# Patient Record
Sex: Male | Born: 1995 | Race: Black or African American | Hispanic: No | Marital: Single | Smoking: Never smoker
Health system: Southern US, Community
[De-identification: ages and names within clinical notes are randomized; demographics above are authoritative.]

---

## 2020-05-17 ENCOUNTER — Encounter (HOSPITAL_COMMUNITY): Payer: Self-pay | Admitting: Emergency Medicine

## 2020-05-17 ENCOUNTER — Other Ambulatory Visit: Payer: Self-pay

## 2020-05-17 ENCOUNTER — Ambulatory Visit (HOSPITAL_COMMUNITY)
Admission: EM | Admit: 2020-05-17 | Discharge: 2020-05-17 | Disposition: A | Discharge status: Home / Self Care | Payer: BC Managed Care – PPO | Attending: Medical Oncology | Admitting: Medical Oncology

## 2020-05-17 DIAGNOSIS — M7652 Patellar tendinitis, left knee: Secondary | ICD-10-CM | POA: Diagnosis not present

## 2020-05-17 DIAGNOSIS — K529 Noninfective gastroenteritis and colitis, unspecified: Secondary | ICD-10-CM | POA: Diagnosis not present

## 2020-05-17 MED ORDER — IBUPROFEN 800 MG PO TABS: 800.0000 mg | ORAL_TABLET | Freq: Three times a day (TID) | ORAL | 0 refills | Status: AC

## 2020-05-17 NOTE — Discharge Instructions (Addendum)
Knee strap

## 2020-05-17 NOTE — ED Triage Notes (Signed)
Woke Thursday feeling bad, abdominal pain, nausea and vomiting and feverish.  On Friday was feeling better, employer asked for him to be evaluated.  Left knee pain for 2 months.  Patient remembers having knee"crossed and sitting on it..stood up "  Has had pain ever since this started.

## 2020-05-17 NOTE — ED Provider Notes (Signed)
[facility]    CSN: [id number] Arrival date & time: 05/17/20  1032      History   Chief Complaint Chief Complaint  Patient presents with  . Letter for School/Work  . Knee Pain    HPI Jesse Wilkinson is a 25 y.o. male.   HPI   Cold symptoms: Pt states that on Thursday he awoke with what felt like food poisioning or a stomach bug. He had abdominal pain, nausea, 2 episodes of non-bloody vomiting and fever. He reports that symptoms resolved as of Friday. He requests a note to return to work. No sick contacts.   Knee Pain: Pt reports that for the past 2 month he has has left knee pain. Started after he was sitting with his knees crossed. Pain described as a tightness that worsened if he stands too long or bends the knee for too long. No previous injuries to this area. When he stood up he started having pain and this has failed to improve. He has not tried anything for symptoms.   History reviewed. No pertinent past medical history.  There are no problems to display for this patient.   History reviewed. No pertinent surgical history.    Home Medications    Prior to Admission medications   Not on File    Family History History reviewed. No pertinent family history.  Social History Social History   Tobacco Use  . Smoking status: Never Smoker  Substance Use Topics  . Alcohol use: Yes  . Drug use: Yes    Types: Marijuana     Allergies   Patient has no known allergies.   Review of Systems Review of Systems  As stated above in HPI Physical Exam Triage Vital Signs ED Triage Vitals  Enc Vitals Group     BP 05/17/20 1128 (!) 142/76     Pulse Rate 05/17/20 1128 60     Resp 05/17/20 1128 18     Temp 05/17/20 1128 98 F (36.7 C)     Temp Source 05/17/20 1128 Oral     SpO2 05/17/20 1128 100 %     Weight --      Height --      Head Circumference --      Peak Flow --      Pain Score 05/17/20 1124 4     Pain Loc --      Pain Edu? --      Excl. in  GC? --    No data found.  Updated Vital Signs BP (!) 142/76 (BP Location: Right Arm) Comment (BP Location): large cuff  Pulse 60   Temp 98 F (36.7 C) (Oral)   Resp 18   SpO2 100%   Visual Acuity Right Eye Distance:   Left Eye Distance:   Bilateral Distance:    Right Eye Near:   Left Eye Near:    Bilateral Near:     Physical Exam Vitals and nursing note reviewed.  Constitutional:      General: He is not in acute distress.    Appearance: Normal appearance. He is not ill-appearing, toxic-appearing or diaphoretic.  HENT:     Head: Normocephalic and atraumatic.     Mouth/Throat:     Mouth: Mucous membranes are moist.  Cardiovascular:     Rate and Rhythm: Normal rate and regular rhythm.     Heart sounds: Normal heart sounds.  Pulmonary:     Effort: Pulmonary effort is normal.     Breath sounds:  Normal breath sounds.  Abdominal:     General: Abdomen is flat. Bowel sounds are normal. There is no distension.     Palpations: Abdomen is soft. There is no mass.     Tenderness: There is no abdominal tenderness. There is no right CVA tenderness, left CVA tenderness, guarding or rebound.     Hernia: No hernia is present.  Musculoskeletal:        General: No swelling, tenderness, deformity or signs of injury. Normal range of motion.     Cervical back: Normal range of motion.     Right lower leg: No edema.     Left lower leg: No edema.     Comments: Tension of the inferior patellar tendon with reproducible mild pain to palpation. Negative Homans sign bilaterally   Lymphadenopathy:     Cervical: No cervical adenopathy.  Skin:    General: Skin is warm.     Coloration: Skin is not jaundiced.  Neurological:     Mental Status: He is alert.  Psychiatric:        Mood and Affect: Mood normal.      UC Treatments / Results  Labs (all labs ordered are listed, but only abnormal results are displayed) Labs Reviewed - No data to display  EKG   Radiology No results  found.  Procedures Procedures (including critical care time)  Medications Ordered in UC Medications - No data to display  Initial Impression / Assessment and Plan / UC Course  I have reviewed the triage vital signs and the nursing notes.  Pertinent labs & imaging results that were available during my care of the patient were reviewed by me and considered in my medical decision making (see chart for details).     1. New and resolved. Work note given.   2. New. No x ray indicated at this time given history and exam. Discussed with patient. I have recommended a knee strap as well as NSAIDs in a few days (as to allow his stomach to fully heal from his recent gastroenteritis). If this fails to improve his symptoms I would recommend a specialist.  Final Clinical Impressions(s) / UC Diagnoses   Final diagnoses:  None   Discharge Instructions   None    ED Prescriptions    None     PDMP not reviewed this encounter.   Rushie Chestnut, [place] 05/17/20 1204

## 2020-05-26 ENCOUNTER — Emergency Department (HOSPITAL_COMMUNITY)
Admission: EM | Admit: 2020-05-26 | Discharge: 2020-05-26 | Disposition: A | Discharge status: Home / Self Care | Payer: BC Managed Care – PPO | Attending: Emergency Medicine | Admitting: Emergency Medicine

## 2020-05-26 ENCOUNTER — Other Ambulatory Visit: Payer: Self-pay

## 2020-05-26 ENCOUNTER — Emergency Department (HOSPITAL_COMMUNITY): Payer: BC Managed Care – PPO

## 2020-05-26 DIAGNOSIS — S42126A Nondisplaced fracture of acromial process, unspecified shoulder, initial encounter for closed fracture: Secondary | ICD-10-CM | POA: Insufficient documentation

## 2020-05-26 DIAGNOSIS — S4992XA Unspecified injury of left shoulder and upper arm, initial encounter: Secondary | ICD-10-CM | POA: Diagnosis present

## 2020-05-26 DIAGNOSIS — S42125A Nondisplaced fracture of acromial process, left shoulder, initial encounter for closed fracture: Secondary | ICD-10-CM

## 2020-05-26 DIAGNOSIS — W1839XA Other fall on same level, initial encounter: Secondary | ICD-10-CM | POA: Insufficient documentation

## 2020-05-26 MED ORDER — OXYCODONE-ACETAMINOPHEN 5-325 MG PO TABS: 1.0000 | ORAL_TABLET | ORAL | 0 refills | Status: AC | PRN

## 2020-05-26 MED ORDER — OXYCODONE-ACETAMINOPHEN 5-325 MG PO TABS
2.0000 | ORAL_TABLET | Freq: Once | ORAL | Status: AC
  Administered 2020-05-26: 2 via ORAL
  Filled 2020-05-26: qty 2

## 2020-05-26 NOTE — ED Triage Notes (Signed)
Pt c/o L shoulder pain, unsure of injury. Reports decreased ROM. Sling in place

## 2020-05-26 NOTE — ED Provider Notes (Signed)
[facility] EMERGENCY DEPARTMENT Provider Note   CSN: [id number] Arrival date & time: 05/26/20  0131     History Chief Complaint  Patient presents with  . Shoulder Pain    Jesse Wilkinson is a 25 y.o. male.  The history is provided by the patient and medical records.    25 y.o. M here with left shoulder pain.  States yesterday he fell and tried to catch himself with left arm and felt something pop in his left shoulder.  States he went to bed and thought it was ok but pain worsened upon waking this morning.  States he is having a hard time lifting his arm up at all.  He denies numbness/weakness. He is left hand dominant.  Motrin taken PTA.    No past medical history on file.  There are no problems to display for this patient.   No past surgical history on file.     No family history on file.  Social History   Tobacco Use  . Smoking status: Never Smoker  Substance Use Topics  . Alcohol use: Yes  . Drug use: Yes    Types: Marijuana    Home Medications Prior to Admission medications   Medication Sig Start Date End Date Taking? Authorizing Provider  ibuprofen (ADVIL) 800 MG tablet Take 1 tablet (800 mg total) by mouth 3 (three) times daily. 05/17/20   Rushie Chestnut, PA-C    Allergies    Patient has no known allergies.  Review of Systems   Review of Systems  Musculoskeletal: Positive for arthralgias.  All other systems reviewed and are negative.   Physical Exam Updated Vital Signs BP (!) 153/89 (BP Location: Right Arm)   Pulse 67   Temp 98.1 F (36.7 C) (Oral)   Resp 16   SpO2 100%   Physical Exam Vitals and nursing note reviewed.  Constitutional:      Appearance: He is well-developed.  HENT:     Head: Normocephalic and atraumatic.  Eyes:     Conjunctiva/sclera: Conjunctivae normal.     Pupils: Pupils are equal, round, and reactive to light.  Cardiovascular:     Rate and Rhythm: Normal rate and regular rhythm.     Heart sounds:  Normal heart sounds.  Pulmonary:     Effort: Pulmonary effort is normal.     Breath sounds: Normal breath sounds.  Abdominal:     General: Bowel sounds are normal.     Palpations: Abdomen is soft.  Musculoskeletal:        General: Normal range of motion.     Cervical back: Normal range of motion.     Comments: Tenderness along posterior left shoulder, no gross deformity, pain elicited with attempted ROM, normal ROM of elbow, wrists, fingers, good radial pulse and cap refill, normal sensation throughout  Skin:    General: Skin is warm and dry.  Neurological:     Mental Status: He is alert and oriented to person, place, and time.     ED Results / Procedures / Treatments   Labs (all labs ordered are listed, but only abnormal results are displayed) Labs Reviewed - No data to display  EKG None  Radiology DG Shoulder Left  Result Date: 05/26/2020 CLINICAL DATA:  Decreased range of motion EXAM: LEFT SHOULDER - 2+ VIEW COMPARISON:  None. FINDINGS: No fracture or malalignment at the glenohumeral interval. Suspected lucency at the acromion on the Y-view. AC joint does not appear widened. Left lung apex is  clear IMPRESSION: Suspected lucency at the acromion, could represent os acromiale, though given history of trauma, acromial fracture could also be considered. Electronically Signed   By: Jasmine Pang M.D.   On: 05/26/2020 02:58    Procedures Procedures   Medications Ordered in ED Medications  oxyCODONE-acetaminophen (PERCOCET/ROXICET) 5-325 MG per tablet 2 tablet (2 tablets Oral Given 05/26/20 8032)    ED Course  I have reviewed the triage vital signs and the nursing notes.  Pertinent labs & imaging results that were available during my care of the patient were reviewed by me and considered in my medical decision making (see chart for details).    MDM Rules/Calculators/A&P  25 y.o. M here with left shoulder pain after a FOOSH type injury yesterday.  Now has increased pain when  trying to move left arm. He is left hand dominant.  On exam, tenderness along posterior aspect.  Limited ROM due to pain.  Arm is NVI.  X-ray with likely acromion fracture.  Patient arrived with sling but this appears too small for his size-- exchanged for sling immobilizer.  Rx percocet, close follow-up with orthopedics.  Return here for new concerns.  Final Clinical Impression(s) / ED Diagnoses Final diagnoses:  Closed nondisplaced fracture of acromial process of left scapula, initial encounter    Rx / DC Orders ED Discharge Orders         Ordered    oxyCODONE-acetaminophen (PERCOCET) 5-325 MG tablet  Every 4 hours PRN        05/26/20 0533           Garlon Hatchet, PA-C 05/26/20 0542    Gilda Crease, MD 05/27/20 [phone]

## 2020-05-26 NOTE — Discharge Instructions (Signed)
Take the prescribed medication as directed.  Do not drive while taking this. Follow-up with Dr. Charlann Boxer-- call his office today to schedule follow-up. Return to the ED for new or worsening symptoms.

## 2020-05-30 ENCOUNTER — Other Ambulatory Visit: Payer: Self-pay | Admitting: Orthopedic Surgery

## 2020-05-30 DIAGNOSIS — M25512 Pain in left shoulder: Secondary | ICD-10-CM

## 2021-05-29 IMAGING — CR DG SHOULDER 2+V*L*
3 series · 3 of 3 positions shown · non-contrast
Comparison: None.

CLINICAL DATA: Decreased range of motion

EXAM:
LEFT SHOULDER - 2+ VIEW

[shoulder grashey]
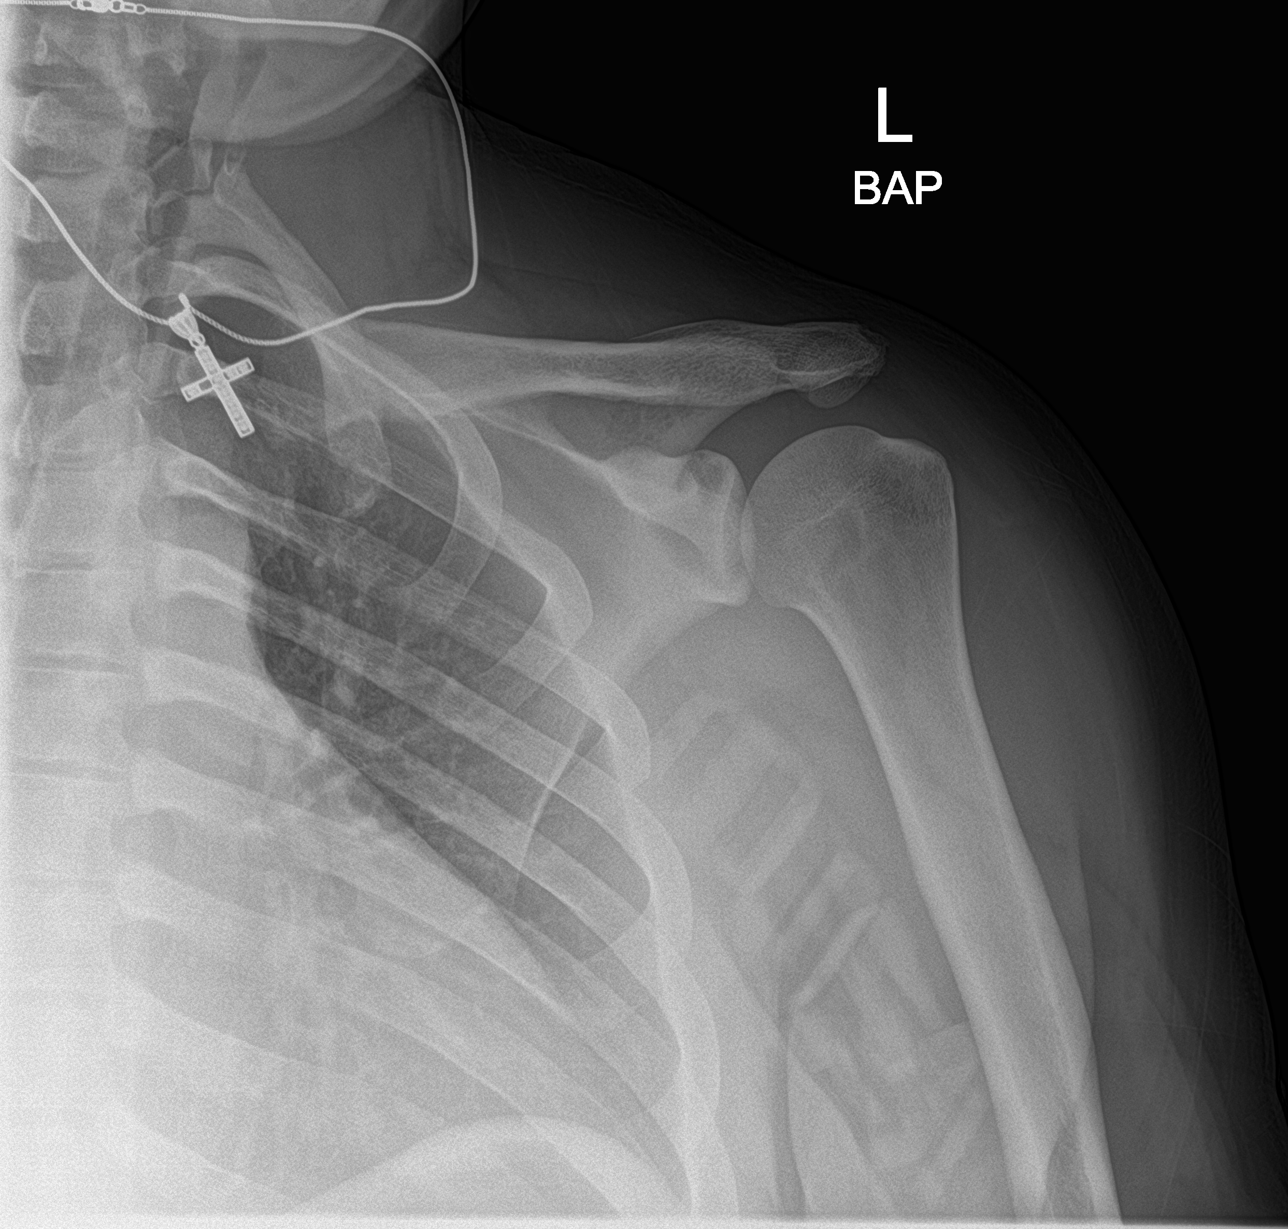

[shoulder y view]
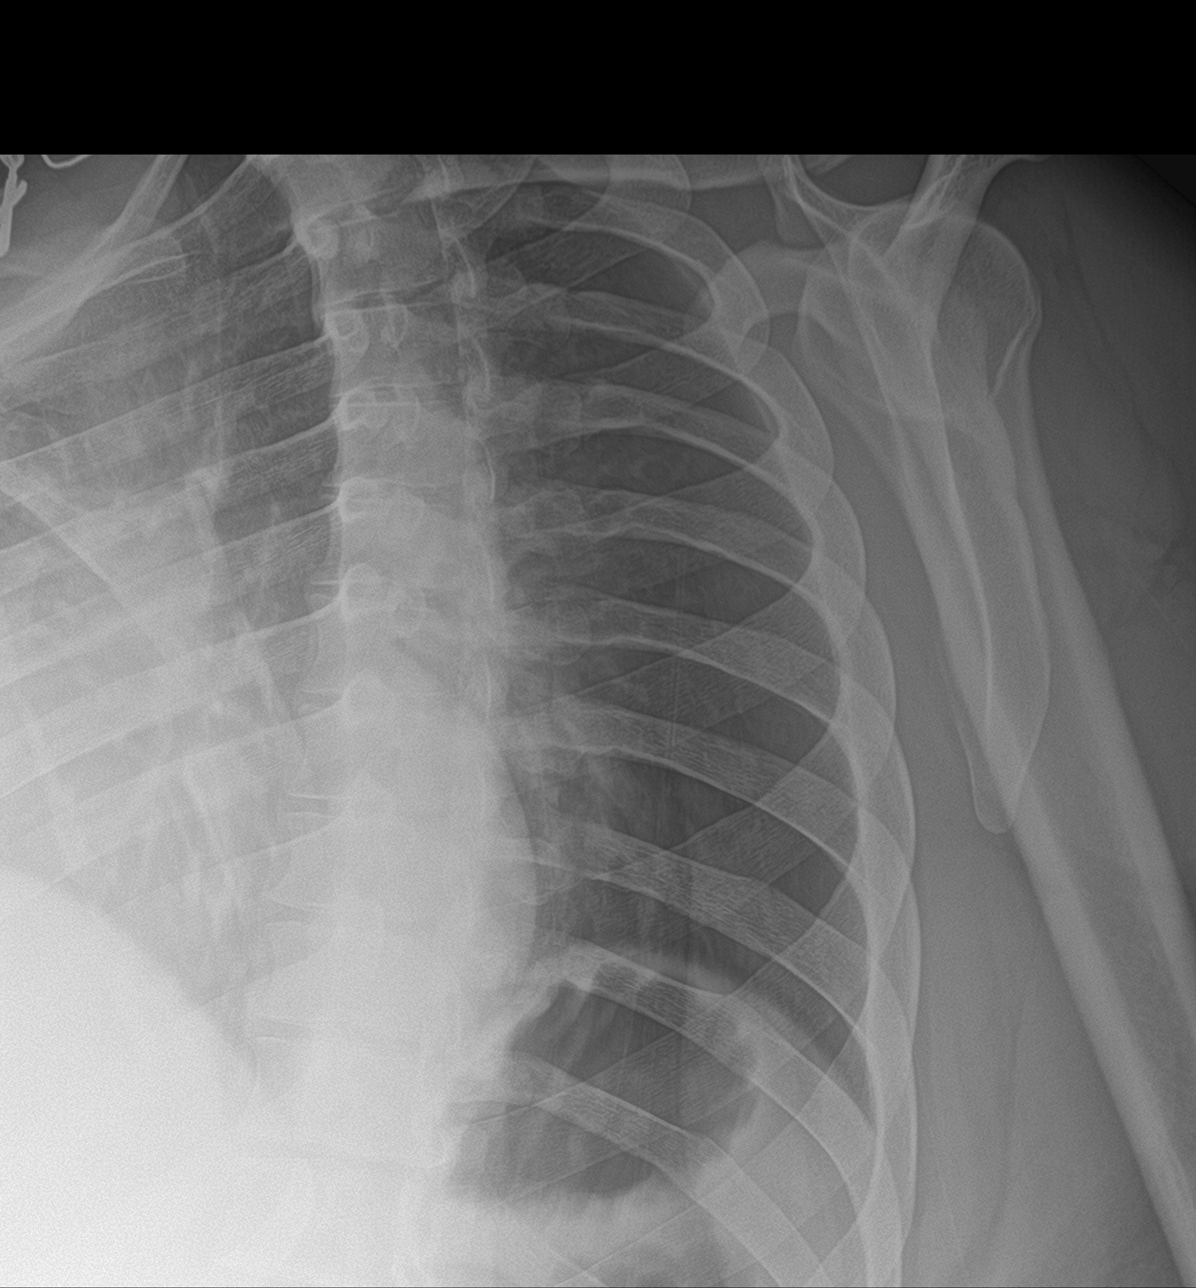

[shoulder ap neutral]
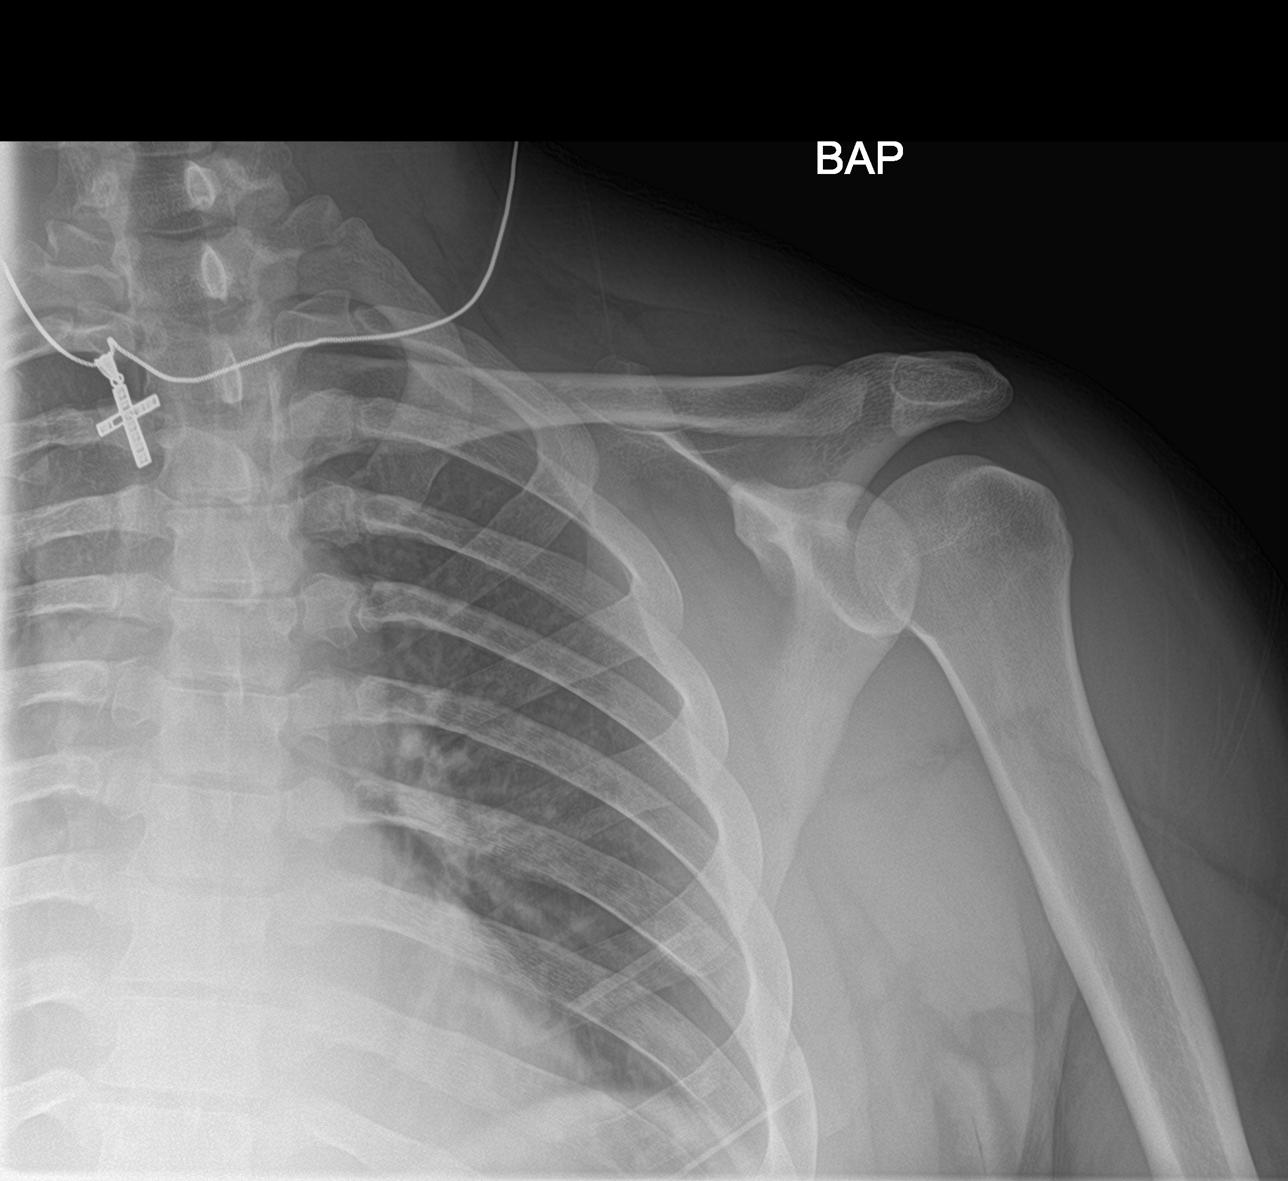

[3 of 3 positions shown; findings below may reference images not displayed]

FINDINGS: No fracture or malalignment at the glenohumeral interval. Suspected
lucency at the acromion on the Y-view. AC joint does not appear
widened. Left lung apex is clear
IMPRESSION: Suspected lucency at the acromion, could represent os acromiale,
though given history of trauma, acromial fracture could also be
considered.
